# Patient Record
Sex: Male | Born: 1980 | Hispanic: Yes | Smoking: Never smoker
Health system: Southern US, Community
[De-identification: ages and names within clinical notes are randomized; demographics above are authoritative.]

---

## 2009-12-13 ENCOUNTER — Emergency Department (HOSPITAL_BASED_OUTPATIENT_CLINIC_OR_DEPARTMENT_OTHER): Admission: EM | Admit: 2009-12-13 | Discharge: 2009-12-14 | Payer: Self-pay | Admitting: Emergency Medicine

## 2010-08-09 ENCOUNTER — Emergency Department (HOSPITAL_COMMUNITY)
Admission: EM | Admit: 2010-08-09 | Discharge: 2010-08-09 | Payer: Self-pay | Source: Home / Self Care | Admitting: Emergency Medicine

## 2010-10-02 ENCOUNTER — Emergency Department (INDEPENDENT_AMBULATORY_CARE_PROVIDER_SITE_OTHER): Payer: 59

## 2010-10-02 ENCOUNTER — Emergency Department (HOSPITAL_BASED_OUTPATIENT_CLINIC_OR_DEPARTMENT_OTHER)
Admission: EM | Admit: 2010-10-02 | Discharge: 2010-10-02 | Disposition: A | Discharge status: Home / Self Care | Payer: 59 | Attending: Emergency Medicine | Admitting: Emergency Medicine

## 2010-10-02 DIAGNOSIS — F17200 Nicotine dependence, unspecified, uncomplicated: Secondary | ICD-10-CM | POA: Insufficient documentation

## 2010-10-02 DIAGNOSIS — R1013 Epigastric pain: Secondary | ICD-10-CM | POA: Insufficient documentation

## 2010-10-02 DIAGNOSIS — R079 Chest pain, unspecified: Secondary | ICD-10-CM | POA: Insufficient documentation

## 2010-10-02 LAB — CBC
HCT: 41.6 % (ref 39.0–52.0)
MCH: 28.2 pg (ref 26.0–34.0)
MCV: 83.2 fL (ref 78.0–100.0)
Platelets: 235 10*3/uL (ref 150–400)
RBC: 5 MIL/uL (ref 4.22–5.81)
RDW: 12.2 % (ref 11.5–15.5)

## 2010-10-02 LAB — BASIC METABOLIC PANEL
BUN: 14 mg/dL (ref 6–23)
Chloride: 101 mEq/L (ref 96–112)
Creatinine, Ser: 0.8 mg/dL (ref 0.4–1.5)
Glucose, Bld: 87 mg/dL (ref 70–99)

## 2010-10-02 LAB — POCT CARDIAC MARKERS

## 2010-10-02 LAB — DIFFERENTIAL
Eosinophils Absolute: 0.1 10*3/uL (ref 0.0–0.7)
Eosinophils Relative: 1 % (ref 0–5)
Lymphs Abs: 2.4 10*3/uL (ref 0.7–4.0)
Monocytes Relative: 7 % (ref 3–12)

## 2015-07-20 ENCOUNTER — Emergency Department (HOSPITAL_BASED_OUTPATIENT_CLINIC_OR_DEPARTMENT_OTHER): Payer: Self-pay

## 2015-07-20 ENCOUNTER — Emergency Department (HOSPITAL_BASED_OUTPATIENT_CLINIC_OR_DEPARTMENT_OTHER)
Admission: EM | Admit: 2015-07-20 | Discharge: 2015-07-20 | Disposition: A | Discharge status: Home / Self Care | Payer: Self-pay | Attending: Physician Assistant | Admitting: Physician Assistant

## 2015-07-20 ENCOUNTER — Encounter (HOSPITAL_BASED_OUTPATIENT_CLINIC_OR_DEPARTMENT_OTHER): Payer: Self-pay | Admitting: *Deleted

## 2015-07-20 DIAGNOSIS — G8929 Other chronic pain: Secondary | ICD-10-CM | POA: Insufficient documentation

## 2015-07-20 DIAGNOSIS — M25572 Pain in left ankle and joints of left foot: Secondary | ICD-10-CM | POA: Insufficient documentation

## 2015-07-20 DIAGNOSIS — M7742 Metatarsalgia, left foot: Secondary | ICD-10-CM

## 2015-07-20 NOTE — Discharge Instructions (Signed)
Please read the information we printed on Metatarsalgia and Morton's Nueroma.  You should follow up with a sports medicine doctor or othropeadist for further work up.   Musculoskeletal Pain Musculoskeletal pain is muscle and boney aches and pains. These pains can occur in any part of the body. Your caregiver may treat you without knowing the cause of the pain. They may treat you if blood or urine tests, X-rays, and other tests were normal.  CAUSES There is often not a definite cause or reason for these pains. These pains may be caused by a type of germ (virus). The discomfort may also come from overuse. Overuse includes working out too hard when your body is not fit. Boney aches also come from weather changes. Bone is sensitive to atmospheric pressure changes. HOME CARE INSTRUCTIONS   Ask when your test results will be ready. Make sure you get your test results.  Only take over-the-counter or prescription medicines for pain, discomfort, or fever as directed by your caregiver. If you were given medications for your condition, do not drive, operate machinery or power tools, or sign legal documents for 24 hours. Do not drink alcohol. Do not take sleeping pills or other medications that may interfere with treatment.  Continue all activities unless the activities cause more pain. When the pain lessens, slowly resume normal activities. Gradually increase the intensity and duration of the activities or exercise.  During periods of severe pain, bed rest may be helpful. Lay or sit in any position that is comfortable.  Putting ice on the injured area.  Put ice in a bag.  Place a towel between your skin and the bag.  Leave the ice on for 15 to 20 minutes, 3 to 4 times a day.  Follow up with your caregiver for continued problems and no reason can be found for the pain. If the pain becomes worse or does not go away, it may be necessary to repeat tests or do additional testing. Your caregiver may need to  look further for a possible cause. SEEK IMMEDIATE MEDICAL CARE IF:  You have pain that is getting worse and is not relieved by medications.  You develop chest pain that is associated with shortness or breath, sweating, feeling sick to your stomach (nauseous), or throw up (vomit).  Your pain becomes localized to the abdomen.  You develop any new symptoms that seem different or that concern you. MAKE SURE YOU:   Understand these instructions.  Will watch your condition.  Will get help right away if you are not doing well or get worse.   This information is not intended to replace advice given to you by your health care provider. Make sure you discuss any questions you have with your health care provider.   Document Released: 07/14/2005 Document Revised: 10/06/2011 Document Reviewed: 03/18/2013 Elsevier Interactive Patient Education [employer]

## 2015-07-20 NOTE — ED Notes (Signed)
Left foot wrapped with ace wrap per EDP orders. Pt instructed on application of ace wrap. Also discussed pain control with ibuprofen, and recommended follow up with Sports Med/ Ortho MD per EDP recommendation and discussion, opportunity for questions provided.

## 2015-07-20 NOTE — ED Notes (Signed)
Patient transported to X-ray 

## 2015-07-20 NOTE — ED Notes (Signed)
C/o pain with walking and running at left foot at base of left great toe, also c/o pain and aching at rt foot, ankle area

## 2015-07-20 NOTE — ED Notes (Signed)
Left foot pain, point to area at base of rt great toe, pt states when placing weight on left foot pain increases

## 2015-07-20 NOTE — ED Provider Notes (Signed)
CSN: [id number]     Arrival date & time 07/20/15  [id number] History   First MD Initiated Contact with Patient 07/20/15 [id number]     Chief Complaint  Patient presents with  . Foot Pain     (Consider location/radiation/quality/duration/timing/severity/associated sxs/prior Treatment)  Patient is a 34 rolled very active male who trail runs daily. He is presenting with pain in his left foot. It is on the first metatarsal. He has a pain on the ball of his foot when he applies pressure to it. Otherwise he is not in pain. This is been going on for several days. He reports recent change in footwear. Patient also reports chronic pain in his right ankle when he hyperpronates.  This has  been going on for years.    HPI patient is a History reviewed. No pertinent past medical history. History reviewed. No pertinent past surgical history. No family history on file. Social History  Substance Use Topics  . Smoking status: Never Smoker   . Smokeless tobacco: None  . Alcohol Use: No    Review of Systems  Constitutional: Negative for activity change.  Respiratory: Negative for shortness of breath.   Cardiovascular: Negative for chest pain.  Gastrointestinal: Negative for abdominal pain.      Allergies  Review of patient's allergies indicates no known allergies.  Home Medications   Prior to Admission medications   Not on File   BP 123/80 mmHg  Pulse 86  Temp(Src) 97.8 F (36.6 C) (Oral)  Resp 16  Ht 5\' 6"  (1.676 m)  Wt 168 lb (76.204 kg)  BMI 27.13 kg/m2  SpO2 100% Physical Exam  Constitutional: He is oriented to person, place, and time. He appears well-nourished.  HENT:  Head: Normocephalic.  Eyes: Conjunctivae are normal.  Cardiovascular: Normal rate.   Pulmonary/Chest: Effort normal.  Musculoskeletal:  Normal right and left foot exams. No external signs of trauma. Normal range of motion. Mild tenderness to palpation of left first medical tarsal joint.    Neurological: He is  oriented to person, place, and time.  Skin: Skin is warm and dry. He is not diaphoretic.  Psychiatric: He has a normal mood and affect. His behavior is normal.    ED Course  Procedures (including critical care time) Labs Review Labs Reviewed - No data to display  Imaging Review No results found. I have personally reviewed and evaluated these images and lab results as part of my medical decision-making.   EKG Interpretation None      MDM   Final diagnoses:  None   patient is a very pleasant 34 year old healthy male who runs daily. He is presenting with pain in his left first metatarsal joint. We will do an x-ray to rule out stress fracture. I suspect Morton's neuroma versus metatarsalgia.  We will recommend he follows with sports medicine and otherwise rest, ice elevate and ibuprofen.   8:44 AM Xray negative, follow up with infromation given.     Laurence Folz Randall AnLyn Waino Mounsey, MD 07/20/15 [phone]

## 2015-07-20 NOTE — ED Notes (Signed)
Pt states pain is reduced with ace wrap in place on left foot

## 2016-02-18 IMAGING — DX DG FOOT COMPLETE 3+V*L*
3 series · 3 of 3 positions shown · non-contrast
Comparison: None.

CLINICAL DATA: 34-year-old male, active runner. Left foot pain with
no known injury, first metatarsal region. Pain at the ball of the
foot. Initial encounter.

EXAM:
LEFT FOOT - COMPLETE 3+ VIEW

[foot ap]
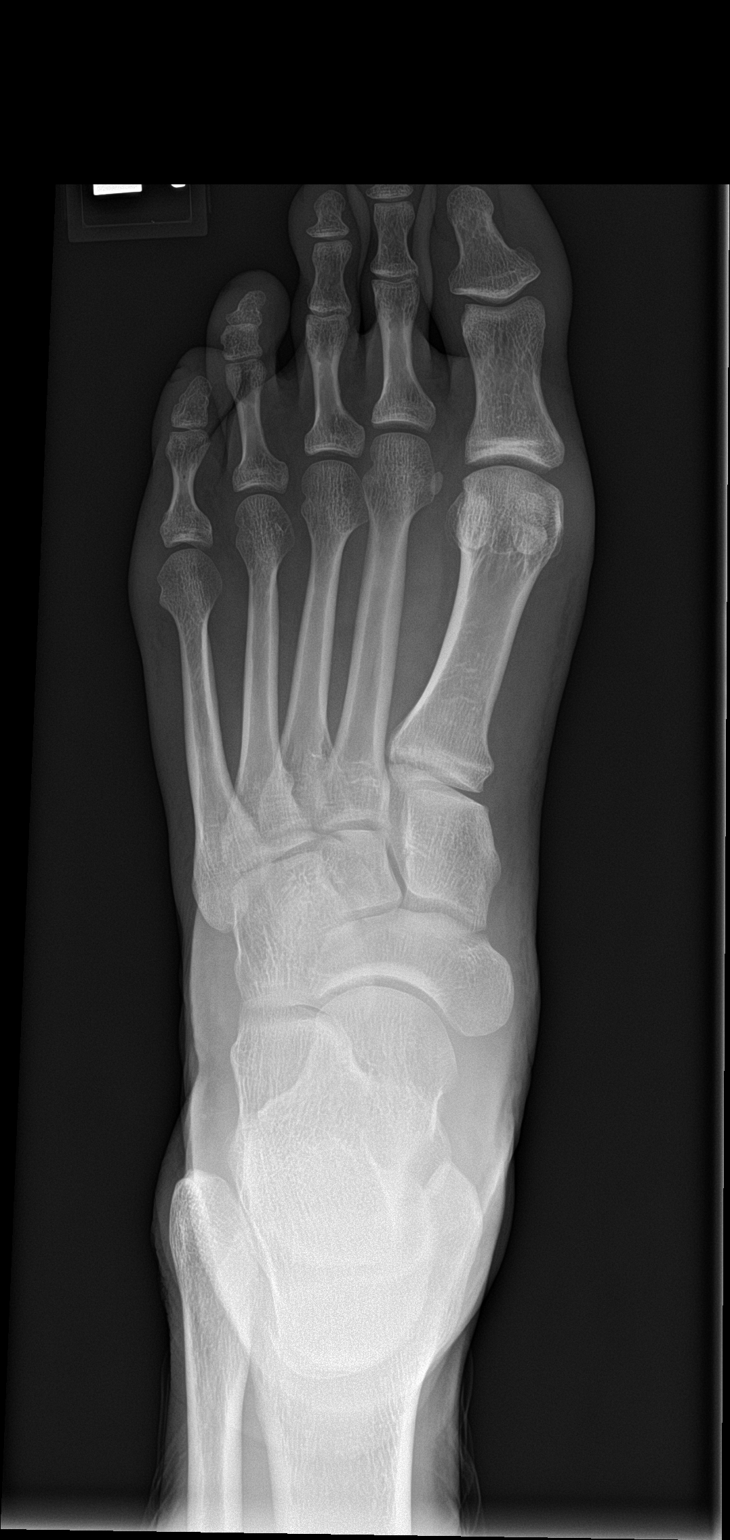

[foot obl]
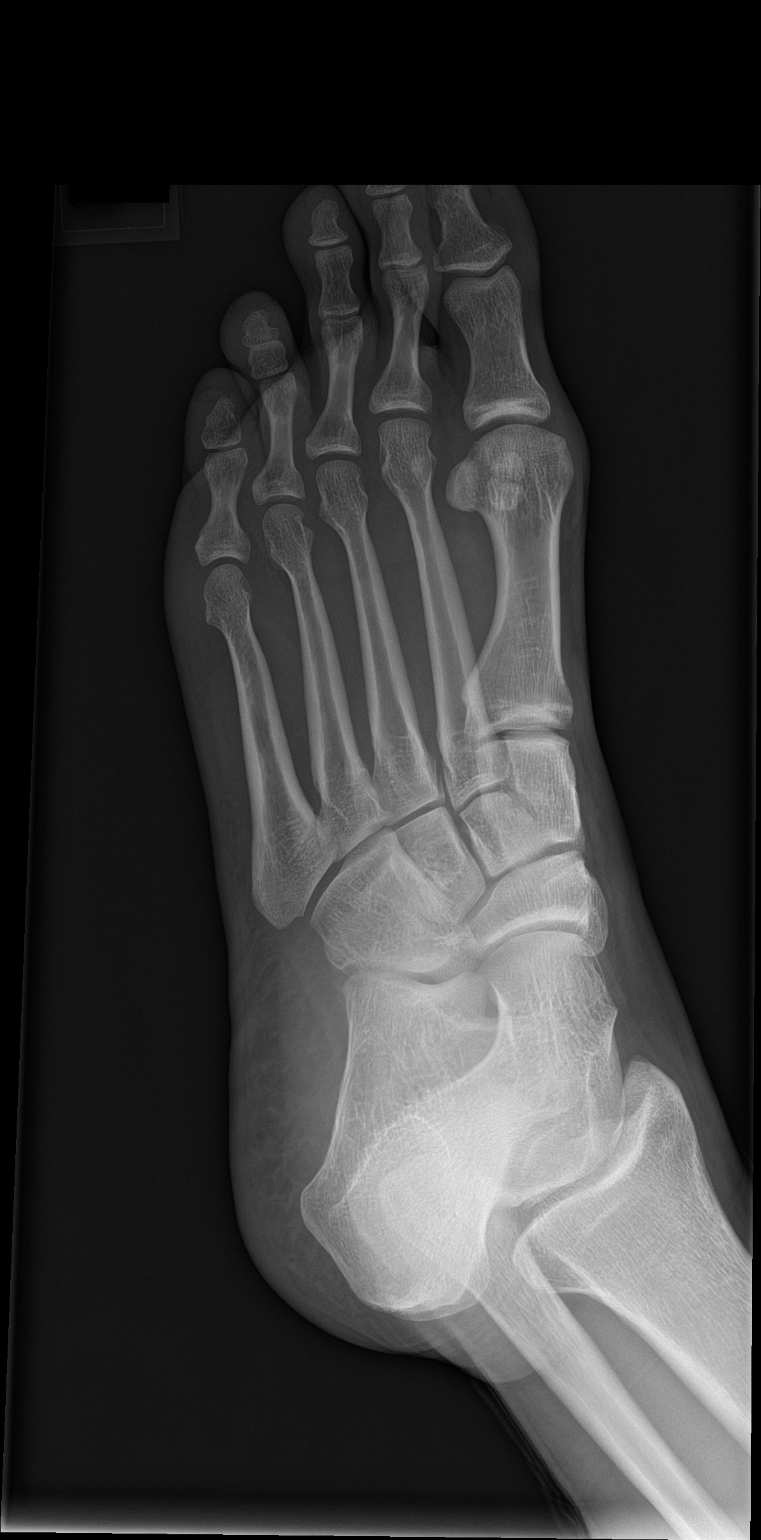

[foot lat]
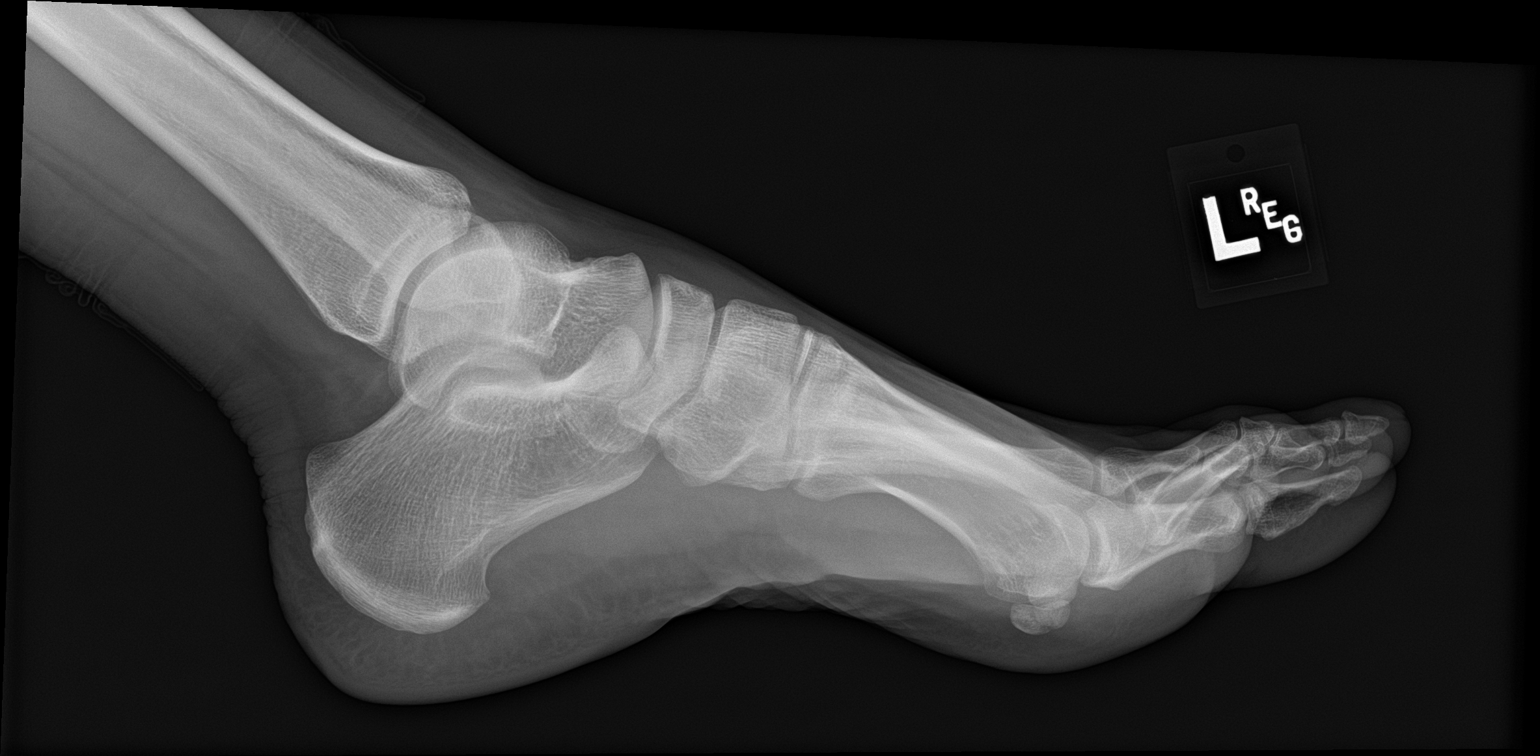

[3 of 3 positions shown; findings below may reference images not displayed]

FINDINGS: Overall bone mineralization is normal. The calcaneus appears intact.
The first metatarsal and first ray appear intact. No periosteal
reaction identified. Joint spaces and alignment within normal
limits. No acute osseous abnormality identified.
IMPRESSION: No acute osseous abnormality identified in the left foot.

If the patient does not improve with conservative treatment,
noncontrast left foot MRI may be the most valuable imaging
follow-up.
